# Patient Record
Sex: Male | Born: 1995 | Hispanic: Yes | Marital: Single | Smoking: Never smoker
Health system: Southern US, Community
[De-identification: ages and names within clinical notes are randomized; demographics above are authoritative.]

---
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
## 2014-01-01 ENCOUNTER — Encounter: Payer: Self-pay | Admitting: Pediatric Cardiology

## 2014-04-08 ENCOUNTER — Emergency Department: Payer: Self-pay | Admitting: Emergency Medicine

## 2014-04-15 ENCOUNTER — Emergency Department: Payer: Self-pay | Admitting: Emergency Medicine

## 2014-07-02 ENCOUNTER — Encounter: Payer: Self-pay | Admitting: Pediatric Cardiology

## 2014-11-15 NOTE — Op Note (Signed)
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   OPERATION:  Right shoulder arthroscopic anterior/inferior labral (Bank[place]) repair with capsulorrhaphy.   ANESTHESIA:  General.  SURGEON:  Thornton Park, M.D.   ESTIMATED BLOOD LOSS:  Minimal.   COMPLICATIONS:  None.   IMPLANTS:  Arthrex Bio-SutureTak x 3.   INDICATIONS FOR PROCEDURE:  The patient is an 19 year old male who has had persistent shoulder instability with multiple anterior shoulder dislocations.  MR arthrogram had been ordered through my office which revealed an anterior/inferior labral tear.  Given the patient's persistent clinical instability, he wished to proceed with arthroscopic repair.  I reviewed the risks and benefits of the surgery with the patient and his mother who was with him in my clinic prior to the date of surgery.  He understood the risks of surgery included, but not limited to infection, bleeding, nerve or blood vessel injury, especially injury to the axillary nerve which may lead to permanent numbness or weakness of the right upper extremity, shoulder stiffness, persistent shoulder pain or instability, failure of the hardware, redislocation and the need for further surgery.  Medical risks include, but are not limited to, DVT and pulmonary embolism, myocardial infarction, stroke, pneumonia, respiratory failure and death.  The patient and his mother understood and agreed with these risks and wished to proceed with surgery.  The patient's mother co-signed on the consent form.   PROCEDURE:  The patient was met in the preoperative area.  His mother was at the bedside.  I marked the right shoulder with the word "yes" according to the hospital's right site protocol.   This was after verbally confirming with the patient that this was the correct site of surgery.  I also confirmed the surgical site with my office notes and the patient's radiographic studies.  I updated the patient's history and physical.  He was then brought to the Operating Room where he underwent general endotracheal intubation.  The patient was placed in a beach chair position.  An examination under anesthesia revealed full right shoulder range of motion.  The patient could be translated anteriorly to the glenoid rim, but could not be frankly dislocated.  The patient did not have a sulcus sign and had no posterior instability with load and shift testing.   The patient was prepped and draped in a sterile fashion.  A timeout was performed to verify the patient's name, date of birth, medical record number, correct site of surgery, and correct procedure to be performed.  This was also used to verify the patient had received antibiotics and that all appropriate instruments, implants and radiographic studies were available in the room.  Once all in attendance were in agreement, the case began.   The patient's bony landmarks were drawn out with a surgical marker along with proposed arthroscopy incisions.  These were pre-injected with 1% lidocaine plain.  The 11 blade was used to establish the posterior portal.  A hemostat was used to spread the fibers of the posterior rotator cuff muscles.  An arthroscopic sheath was then entered into the glenohumeral joint and the arthroscope placed through the sheath.   A full diagnostic examination of the glenohumeral joint was undertaken.  The findings on arthroscopy included fraying of the anterior/superior labrum.  There was no evidence of slap tear.  The anterior-inferior labrum was not well-visualized and the patient was diagnosed with an ALPSA lesion.  The posterior labrum was intact.  There was a small chondral loose body seen, but no focal chondral defects seen of the  glenohumeral joint.  The subscapularis was intact along with the supraspinatus and external rotators.  There is no injury to the biceps tendon.  The patient had an non-engaging Hill-Sachs lesion.  He could be translated anteriorly under direct visualization, but could not be frankly dislocated on exam.  There was no loose bodies seen in the inferior recess and there was no Hagl lesions.   An anterior portal was established using an 18-gauge spinal needle along with an anterolateral portal.  Through the anterior portal, a 5.75 mm arthroscopic cannula was placed.  Through the anterior lateral portal, a 7 mm partially threaded arthroscopic cannula was placed.  An arthroscopic elevator was used to re-establish the interval between the labrum and the bony glenoid.  The ALPSA region was mobilize to approximately the 5:30 to 6 o'clock position.  The inferiormost portion of the labrum was still attached to the glenoid.  Once adequate mobilization was achieved of the anterior labrum, the anterior, nonarticular portion of the glenoid was then burred with a 4-0 resector shaver blade to allow for punctate bleeding to improve labral healing.  Then through the anterior lateral portal, the drill guide for the Bio-SutureTak anchor was placed at approximately the 5 o'clock position just over the articular face of the anterior glenoid.  A single Bio-SutureTak anchor was then placed through this inferior most drill hole.  A 90 degree suture lasso was then placed through the inferiormost portion of the labrum along with a small portion of the capsule.  A single limb of the Bio-SutureTak anchor was then shuttled under the labrum using the Arthrex 90 degree suture lasso.  The labrum was then approximated to the glenoid using an arthroscopic suture technique.  Two additional anchors at approximately the 4 o'clock and 3 o'clock positions were then also placed using a similar technique.  The Bio-SutureTak anchor was placed just on the  articular surface of the anterior glenoid.  The 90 degree suture lasso was used to shuttle one limb of the SuturTek anchor underneath the labrum and the labrum was repaired using arthroscopic technique.  The labral repair was then probed using a nerve hook and felt to be stable.  Initially there was a drive-through sign which was no longer possible following labral repair.  The humeral head could no longer be translated anteriorly.  There was no inferior translation of the humeral head either.  Final arthroscopic images were taken.  The glenohumeral joint was then copiously irrigated and all arthroscopic instruments and cannulas were removed.  The three arthroscopic incisions were closed with 4-0 nylon.  Steri-Strips and Xeroform were placed over each arthroscopic incision.  A dry sterile bandage was applied along with TENS unit pads, a Polar Care sleeve and an abduction sling.  The patient was awoken and transferred to a hospital stretcher and brought to the PACU in stable condition.  I was scrubbed and present for the entire case and all sharp and instrument counts were correct at the conclusion of the case.  I spoke with the patient's mother postoperatively to let her know that her son was stable in the recovery room and the case had gone without complication.    ____________________________ Kevin L. Krasinski, MD klk:ea D: 01/16/2013 19:07:44 ET T: 01/16/2013 23:43:18 ET JOB#: [id number]    cc: Kevin L. Krasinski, MD, <Dictator> KEVIN L KRASINSKI MD ELECTRONICALLY SIGNED 01/22/2013 17:46 

## 2014-12-31 ENCOUNTER — Other Ambulatory Visit
Admission: RE | Admit: 2014-12-31 | Discharge: 2014-12-31 | Disposition: A | Discharge status: Home / Self Care | Payer: Self-pay | Source: Ambulatory Visit | Attending: Pediatric Cardiology | Admitting: Pediatric Cardiology

## 2014-12-31 DIAGNOSIS — I10 Essential (primary) hypertension: Secondary | ICD-10-CM | POA: Insufficient documentation

## 2014-12-31 LAB — BASIC METABOLIC PANEL
Anion gap: 10 (ref 5–15)
BUN: 10 mg/dL (ref 6–20)
CALCIUM: 9.1 mg/dL (ref 8.9–10.3)
CHLORIDE: 102 mmol/L (ref 101–111)
CO2: 27 mmol/L (ref 22–32)
Creatinine, Ser: 1.03 mg/dL (ref 0.61–1.24)
GFR calc Af Amer: >60 mL/min (ref >60)
GFR calc non Af Amer: >60 mL/min (ref >60)
Glucose, Bld: 96 mg/dL (ref 65–99)
POTASSIUM: 3.9 mmol/L (ref 3.5–5.1)
Sodium: 139 mmol/L (ref 135–145)

## 2022-06-29 ENCOUNTER — Ambulatory Visit (INDEPENDENT_AMBULATORY_CARE_PROVIDER_SITE_OTHER): Payer: Self-pay

## 2022-06-29 ENCOUNTER — Ambulatory Visit
Admission: EM | Admit: 2022-06-29 | Discharge: 2022-06-29 | Disposition: A | Discharge status: Home / Self Care | Payer: Self-pay

## 2022-06-29 DIAGNOSIS — M79674 Pain in right toe(s): Secondary | ICD-10-CM

## 2022-06-29 DIAGNOSIS — M109 Gout, unspecified: Secondary | ICD-10-CM

## 2022-06-29 MED ORDER — CEPHALEXIN 500 MG PO CAPS: 500.0000 mg | ORAL_CAPSULE | Freq: Four times a day (QID) | ORAL | 0 refills | Status: AC

## 2022-06-29 MED ORDER — PREDNISONE 10 MG (21) PO TBPK: ORAL_TABLET | Freq: Every day | ORAL | 0 refills | Status: AC

## 2022-06-29 NOTE — ED Provider Notes (Signed)
[facility]    CSN: [id number] Arrival date & time: 06/29/22  0911      History   Chief Complaint Chief Complaint  Patient presents with   Foot Pain    HPI Luis Huang is a 26 y.o. male presents for evaluation of toe pain. Pt reports he awoke on 11/25 with pain to the base of his right toe. Endorses swelling, redness, and warmth. Denies injury or known inciting event. No hx of gout or cellulitis. He did a online telehealth visit 5 days ago and was told it was gout and prescribed 10mg  prednisone daily for 7 days. Pt reports sx have worsened since that time. He has also taken OTC ibuprofen and soaked his toe in a herbal remedy his mother suggested with no relief. No other concerns at this time.    Foot Pain    History reviewed. No pertinent past medical history.  There are no problems to display for this patient.   History reviewed. No pertinent surgical history.     Home Medications    Prior to Admission medications   Medication Sig Start Date End Date Taking? Authorizing Provider  cephALEXin (KEFLEX) 500 MG capsule Take 1 capsule (500 mg total) by mouth 4 (four) times daily for 7 days. 06/29/22 07/06/22 Yes 14/12/23, NP  predniSONE (STERAPRED UNI-PAK 21 TAB) 10 MG (21) TBPK tablet Take by mouth daily. Take 6 tabs by mouth daily  for 2 days, then 5 tabs for 2 days, then 4 tabs for 2 days, then 3 tabs for 2 days, 2 tabs for 2 days, then 1 tab by mouth daily for 2 days 06/29/22  Yes 14/5/23, NP    Family History History reviewed. No pertinent family history.  Social History Social History   Tobacco Use   Smoking status: Never   Smokeless tobacco: Never  Vaping Use   Vaping Use: Never used  Substance Use Topics   Alcohol use: Yes   Drug use: Yes    Types: Marijuana     Allergies   Patient has no allergy information on record.   Review of Systems Review of Systems  Musculoskeletal:        Right great toe pain      Physical  Exam Triage Vital Signs ED Triage Vitals  Enc Vitals Group     BP 06/29/22 1240 (!) 170/104     Pulse Rate 06/29/22 1240 (!) 101     Resp 06/29/22 1240 18     Temp 06/29/22 1240 97.9 F (36.6 C)     Temp Source 06/29/22 1240 Oral     SpO2 06/29/22 1240 99 %     Weight 06/29/22 1237 215 lb (97.5 kg)     Height 06/29/22 1237 5\' 10"  (1.778 m)     Head Circumference --      Peak Flow --      Pain Score 06/29/22 1236 9     Pain Loc --      Pain Edu? --      Excl. in GC? --    No data found.  Updated Vital Signs BP (!) 170/104 (BP Location: Left Arm)   Pulse (!) 101   Temp 97.9 F (36.6 C) (Oral)   Resp 18   Ht 5\' 10"  (1.778 m)   Wt 215 lb (97.5 kg)   SpO2 99%   BMI 30.85 kg/m   Visual Acuity Right Eye Distance:   Left Eye Distance:   Bilateral  Distance:    Right Eye Near:   Left Eye Near:    Bilateral Near:     Physical Exam Vitals and nursing note reviewed.  Constitutional:      Appearance: Normal appearance.  HENT:     Head: Normocephalic and atraumatic.  Eyes:     Pupils: Pupils are equal, round, and reactive to light.  Cardiovascular:     Rate and Rhythm: Normal rate.  Pulmonary:     Effort: Pulmonary effort is normal.  Musculoskeletal:       Feet:  Neurological:     General: No focal deficit present.     Mental Status: He is alert and oriented to person, place, and time.  Psychiatric:        Mood and Affect: Mood normal.        Behavior: Behavior normal.      UC Treatments / Results  Labs (all labs ordered are listed, but only abnormal results are displayed) Labs Reviewed - No data to display  EKG   Radiology DG Toe Great Right  Result Date: 06/29/2022 CLINICAL DATA:  Pain and swelling EXAM: RIGHT GREAT TOE COMPARISON:  10/23/2012 FINDINGS: No fracture or dislocation is seen. There are no focal lytic lesions. There is soft tissue swelling over the first metatarsophalangeal joint and in the big toe. No radiopaque foreign bodies are seen.  IMPRESSION: No radiographic abnormalities are seen in the bony structures in right big toe. Electronically Signed   By: Elmer Picker M.D.   On: 06/29/2022 13:35    Procedures Procedures (including critical care time)  Medications Ordered in UC Medications - No data to display  Initial Impression / Assessment and Plan / UC Course  I have reviewed the triage vital signs and the nursing notes.  Pertinent labs & imaging results that were available during my care of the patient were reviewed by me and considered in my medical decision making (see chart for details).     Reviewed exam and sx with patient Xray neg Discussed gout vs cellulitis Original dose prednisone too low. Will start taper Provisional rx for keflex provided. If symptoms do not improve in 2 days,  pt to start  Discussed low purine foods Follow up with PCP in 2-3 days for re-check  ER precautions reviewed and pt verbalized understanding  Final Clinical Impressions(s) / UC Diagnoses   Final diagnoses:  Great toe pain, right  Acute gout involving toe of right foot, unspecified cause     Discharge Instructions      Start Prednisone daily as prescribed If symptoms do not improve in 2 days, start keflex Please follow up with your PCP if symptoms are not improving Please go to the ER for any worsening symptoms I hope you feel better soon!     ED Prescriptions     Medication Sig Dispense Auth. Provider   predniSONE (STERAPRED UNI-PAK 21 TAB) 10 MG (21) TBPK tablet Take by mouth daily. Take 6 tabs by mouth daily  for 2 days, then 5 tabs for 2 days, then 4 tabs for 2 days, then 3 tabs for 2 days, 2 tabs for 2 days, then 1 tab by mouth daily for 2 days 42 tablet Melynda Ripple, NP   cephALEXin (KEFLEX) 500 MG capsule Take 1 capsule (500 mg total) by mouth 4 (four) times daily for 7 days. 28 capsule Melynda Ripple, NP      PDMP not reviewed this encounter.   Melynda Ripple, NP 06/29/22  1348  

## 2022-06-29 NOTE — ED Triage Notes (Signed)
Pt c/o right foot and toe pain since 06/19/22.  Pt states that the pain is along the right 1st toe  and ball of the foot.   Pt denies any injuries or changes in activity,   Pt was given Prednisone on Thursday  for possible gout and pt states that it has not helped and pain has gotten worse.

## 2022-06-29 NOTE — Discharge Instructions (Signed)
Start Prednisone daily as prescribed If symptoms do not improve in 2 days, start keflex Please follow up with your PCP if symptoms are not improving Please go to the ER for any worsening symptoms I hope you feel better soon!
